# Patient Record
Sex: Male | Born: 1990 | Race: Black or African American | Hispanic: No | Marital: Married
Health system: Southern US, Community
[De-identification: ages and names within clinical notes are randomized; demographics above are authoritative.]

---

## 2015-11-27 ENCOUNTER — Encounter (HOSPITAL_COMMUNITY): Payer: Self-pay

## 2015-11-27 ENCOUNTER — Emergency Department (HOSPITAL_COMMUNITY)

## 2015-11-27 ENCOUNTER — Emergency Department (HOSPITAL_COMMUNITY)
Admission: EM | Admit: 2015-11-27 | Discharge: 2015-11-27 | Disposition: A | Discharge status: Home / Self Care | Attending: Emergency Medicine | Admitting: Emergency Medicine

## 2015-11-27 DIAGNOSIS — Y9389 Activity, other specified: Secondary | ICD-10-CM | POA: Insufficient documentation

## 2015-11-27 DIAGNOSIS — S21101A Unspecified open wound of right front wall of thorax without penetration into thoracic cavity, initial encounter: Secondary | ICD-10-CM | POA: Diagnosis not present

## 2015-11-27 DIAGNOSIS — S31109A Unspecified open wound of abdominal wall, unspecified quadrant without penetration into peritoneal cavity, initial encounter: Secondary | ICD-10-CM | POA: Diagnosis not present

## 2015-11-27 DIAGNOSIS — Y998 Other external cause status: Secondary | ICD-10-CM | POA: Insufficient documentation

## 2015-11-27 DIAGNOSIS — Y9289 Other specified places as the place of occurrence of the external cause: Secondary | ICD-10-CM | POA: Insufficient documentation

## 2015-11-27 DIAGNOSIS — S31119A Laceration without foreign body of abdominal wall, unspecified quadrant without penetration into peritoneal cavity, initial encounter: Secondary | ICD-10-CM

## 2015-11-27 LAB — ABO/RH: ABO/RH(D): B POS

## 2015-11-27 LAB — TYPE AND SCREEN
ABO/RH(D): B POS
Antibody Screen: NEGATIVE
Unit division: 0
Unit division: 0

## 2015-11-27 LAB — CBC
HCT: 45.4 % (ref 39.0–52.0)
HEMOGLOBIN: 16 g/dL (ref 13.0–17.0)
MCH: 30.2 pg (ref 26.0–34.0)
MCHC: 35.2 g/dL (ref 30.0–36.0)
MCV: 85.7 fL (ref 78.0–100.0)
Platelets: 270 10*3/uL (ref 150–400)
RBC: 5.3 MIL/uL (ref 4.22–5.81)
RDW: 12.7 % (ref 11.5–15.5)
WBC: 6.1 10*3/uL (ref 4.0–10.5)

## 2015-11-27 LAB — COMPREHENSIVE METABOLIC PANEL
ALBUMIN: 4.1 g/dL (ref 3.5–5.0)
ALK PHOS: 58 U/L (ref 38–126)
ALT: 23 U/L (ref 17–63)
ANION GAP: 12 (ref 5–15)
AST: 22 U/L (ref 15–41)
BUN: 13 mg/dL (ref 6–20)
CALCIUM: 9.4 mg/dL (ref 8.9–10.3)
CO2: 25 mmol/L (ref 22–32)
Chloride: 103 mmol/L (ref 101–111)
Creatinine, Ser: 1.01 mg/dL (ref 0.61–1.24)
GFR calc Af Amer: >60 mL/min (ref >60)
GFR calc non Af Amer: >60 mL/min (ref >60)
GLUCOSE: 107 mg/dL — AB (ref 65–99)
Potassium: 3.5 mmol/L (ref 3.5–5.1)
SODIUM: 140 mmol/L (ref 135–145)
Total Bilirubin: 0.6 mg/dL (ref 0.3–1.2)
Total Protein: 7.5 g/dL (ref 6.5–8.1)

## 2015-11-27 LAB — ETHANOL: ALCOHOL ETHYL (B): 30 mg/dL — AB (ref <5)

## 2015-11-27 LAB — PROTIME-INR
INR: 1.1 (ref 0.00–1.49)
Prothrombin Time: 14.4 seconds (ref 11.6–15.2)

## 2015-11-27 LAB — CDS SEROLOGY

## 2015-11-27 MED ORDER — IOHEXOL 300 MG/ML  SOLN
100.0000 mL | Freq: Once | INTRAMUSCULAR | Status: AC | PRN
  Administered 2015-11-27: 100 mL via INTRAVENOUS

## 2015-11-27 MED ORDER — SODIUM CHLORIDE 0.9 % IV BOLUS (SEPSIS)
500.0000 mL | Freq: Once | INTRAVENOUS | Status: AC
  Administered 2015-11-27: 500 mL via INTRAVENOUS
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
## 2015-11-27 NOTE — ED Notes (Signed)
Pt returned from CT scan.
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
## 2015-11-27 NOTE — ED Notes (Signed)
Dr. Jodi MourningZavitz at bedside to suture wound.

## 2015-11-27 NOTE — ED Notes (Signed)
MD at bedside. 
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
## 2015-11-27 NOTE — Discharge Instructions (Signed)
If you were given medicines take as directed.  If you are on coumadin or contraceptives realize their levels and effectiveness is altered by many different medicines.  If you have any reaction (rash, tongues swelling, other) to the medicines stop taking and see a physician.    If your blood pressure was elevated in the ER make sure you follow up for management with a primary doctor or return for chest pain, shortness of breath or stroke symptoms.  Please follow up as directed and return to the ER or see a physician for new or worsening symptoms.  Thank you. Filed Vitals:   11/27/15 0412 11/27/15 0545 11/27/15 0600 11/27/15 0615  BP:  127/85 135/81 106/90  Pulse:  93 98 102  Temp:      TempSrc:      Resp:      Height: 5\' 10"  (1.778 m)     Weight: 206 lb (93.441 kg)     SpO2:  100% 99% 100%
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
